# Patient Record
Sex: Female | Born: 1980 | Race: White | Hispanic: No | Marital: Married | Smoking: Never smoker
Health system: Southern US, Community
[De-identification: ages and names within clinical notes are randomized; demographics above are authoritative.]

## PROBLEM LIST (undated history)

## (undated) DIAGNOSIS — Z789 Other specified health status: Secondary | ICD-10-CM

## (undated) HISTORY — PX: SKIN CANCER EXCISION: SHX779

## (undated) HISTORY — PX: RECTAL SURGERY: SHX760

## (undated) HISTORY — PX: LASIK: SHX215

---

## 2013-02-16 ENCOUNTER — Other Ambulatory Visit (HOSPITAL_COMMUNITY): Payer: Self-pay | Admitting: Gynecology

## 2013-02-16 DIAGNOSIS — Z3141 Encounter for fertility testing: Secondary | ICD-10-CM

## 2013-02-28 ENCOUNTER — Ambulatory Visit (HOSPITAL_COMMUNITY)
Admission: RE | Admit: 2013-02-28 | Discharge: 2013-02-28 | Disposition: A | Discharge status: Home / Self Care | Payer: BC Managed Care – PPO | Source: Ambulatory Visit | Attending: Gynecology | Admitting: Gynecology

## 2013-02-28 DIAGNOSIS — N979 Female infertility, unspecified: Secondary | ICD-10-CM | POA: Insufficient documentation

## 2013-02-28 DIAGNOSIS — Z3141 Encounter for fertility testing: Secondary | ICD-10-CM

## 2013-02-28 MED ORDER — IOHEXOL 300 MG/ML  SOLN
20.0000 mL | Freq: Once | INTRAMUSCULAR | Status: AC | PRN
  Administered 2013-02-28: 20 mL

## 2013-06-26 LAB — OB RESULTS CONSOLE RUBELLA ANTIBODY, IGM: RUBELLA: IMMUNE

## 2013-06-26 LAB — OB RESULTS CONSOLE HIV ANTIBODY (ROUTINE TESTING): HIV: NONREACTIVE

## 2013-06-26 LAB — OB RESULTS CONSOLE ABO/RH: RH Type: POSITIVE

## 2013-06-26 LAB — OB RESULTS CONSOLE ANTIBODY SCREEN: ANTIBODY SCREEN: NEGATIVE

## 2013-06-26 LAB — OB RESULTS CONSOLE HEPATITIS B SURFACE ANTIGEN: HEP B S AG: NEGATIVE

## 2013-06-26 LAB — OB RESULTS CONSOLE RPR: RPR: NONREACTIVE

## 2013-07-03 ENCOUNTER — Other Ambulatory Visit: Payer: Self-pay

## 2013-07-03 LAB — OB RESULTS CONSOLE GC/CHLAMYDIA
Chlamydia: NEGATIVE
Gonorrhea: NEGATIVE

## 2013-09-27 ENCOUNTER — Other Ambulatory Visit (HOSPITAL_COMMUNITY): Payer: Self-pay | Admitting: Obstetrics and Gynecology

## 2013-09-27 DIAGNOSIS — O43129 Velamentous insertion of umbilical cord, unspecified trimester: Secondary | ICD-10-CM

## 2013-09-28 ENCOUNTER — Encounter (HOSPITAL_COMMUNITY): Payer: Self-pay | Admitting: Obstetrics and Gynecology

## 2013-10-03 ENCOUNTER — Encounter (HOSPITAL_COMMUNITY): Payer: Self-pay

## 2013-10-03 ENCOUNTER — Ambulatory Visit (HOSPITAL_COMMUNITY)
Admission: RE | Admit: 2013-10-03 | Discharge: 2013-10-03 | Disposition: A | Discharge status: Home / Self Care | Payer: BC Managed Care – PPO | Source: Ambulatory Visit | Attending: Obstetrics and Gynecology | Admitting: Obstetrics and Gynecology

## 2013-10-03 ENCOUNTER — Other Ambulatory Visit (HOSPITAL_COMMUNITY): Payer: Self-pay | Admitting: Obstetrics and Gynecology

## 2013-10-03 DIAGNOSIS — O358XX0 Maternal care for other (suspected) fetal abnormality and damage, not applicable or unspecified: Secondary | ICD-10-CM | POA: Insufficient documentation

## 2013-10-03 DIAGNOSIS — Z363 Encounter for antenatal screening for malformations: Secondary | ICD-10-CM | POA: Insufficient documentation

## 2013-10-03 DIAGNOSIS — IMO0001 Reserved for inherently not codable concepts without codable children: Secondary | ICD-10-CM | POA: Insufficient documentation

## 2013-10-03 DIAGNOSIS — Z1389 Encounter for screening for other disorder: Secondary | ICD-10-CM | POA: Insufficient documentation

## 2013-10-03 DIAGNOSIS — O43129 Velamentous insertion of umbilical cord, unspecified trimester: Secondary | ICD-10-CM

## 2013-10-03 NOTE — Progress Notes (Signed)
[facility] ultrasound   Indication: 33 yr old G1P0 at 4215w3d for fetal anatomic survey. Finding of velamentous cord insertion on outside ultrasound.  Findings: 1. Single intrauterine pregnancy. 2. Estimated fetal weight is in the 50th%. 3. Posterior placenta without evidence of previa. 4. There is a velamentous cord insertion. There is no evidence of vasa previa. 5. Normal amniotic fluid volume. 6. Normal transvaginal cervical length. 7. The views of the cavum and heart are limited. 8. The remainder of the limited anatomy survey is normal.  Recommendations: 1. Appropriate fetal growth. 2. Limited anatomy survey: - recommend follow up in 3 weeks to complete anatomic survey 3. Velamentous cord insertion: - discussed no evidence of vasa previa- reevaluate in 3 weeks - bleeding precautions reviewed - discussed association with increased risk of fetal growth restriction - recommend fetal growth every 4-6 weeks - recommend antenatal testing only in the setting of growth restriction  Eulis FosterKristen Jadore Mcguffin, MD

## 2013-10-20 ENCOUNTER — Other Ambulatory Visit (HOSPITAL_COMMUNITY): Payer: Self-pay | Admitting: Obstetrics and Gynecology

## 2013-10-20 DIAGNOSIS — O43129 Velamentous insertion of umbilical cord, unspecified trimester: Secondary | ICD-10-CM

## 2013-10-24 ENCOUNTER — Ambulatory Visit (HOSPITAL_COMMUNITY)
Admission: RE | Admit: 2013-10-24 | Discharge: 2013-10-24 | Disposition: A | Discharge status: Home / Self Care | Payer: BC Managed Care – PPO | Source: Ambulatory Visit | Attending: Obstetrics and Gynecology | Admitting: Obstetrics and Gynecology

## 2013-10-24 DIAGNOSIS — O43129 Velamentous insertion of umbilical cord, unspecified trimester: Secondary | ICD-10-CM

## 2013-10-24 DIAGNOSIS — IMO0001 Reserved for inherently not codable concepts without codable children: Secondary | ICD-10-CM | POA: Insufficient documentation

## 2013-11-21 ENCOUNTER — Ambulatory Visit (HOSPITAL_COMMUNITY): Payer: BC Managed Care – PPO

## 2013-12-17 IMAGING — RF DG HYSTEROGRAM
5 series · 5 of 5 positions shown · non-contrast
Comparison: none

CLINICAL DATA: Fertility testing

HYSTEROSALPINGOGRAM
TECHNIQUE: Hysterosalpingogram was performed by the ordering
physician under fluoroscopy.  Fluoroscopic images are submitted for
interpretation following the procedure.

[Series 1: run · 1 of 1 slices shown (1 of 5)]
[im 1/1]
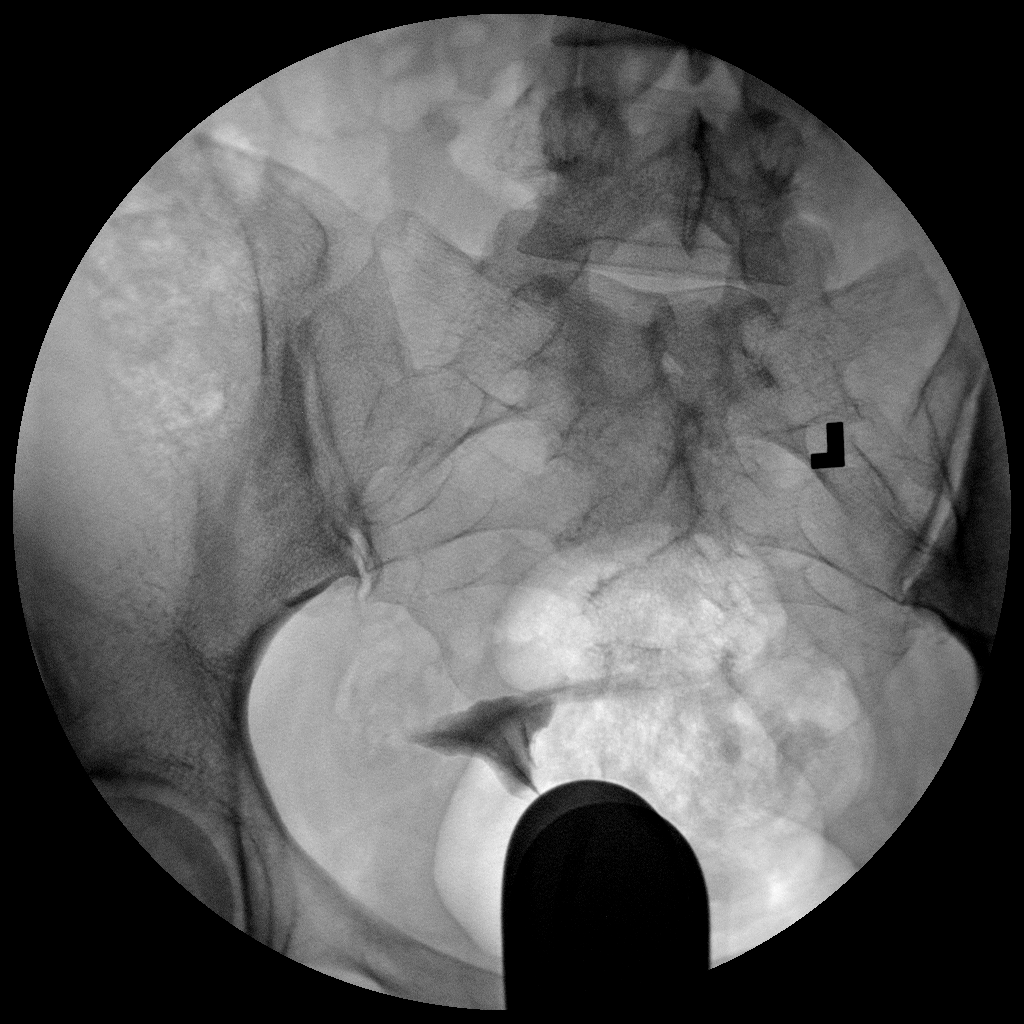

[Series 2: run · 1 of 1 slices shown (2 of 5)]
[im 1/1]
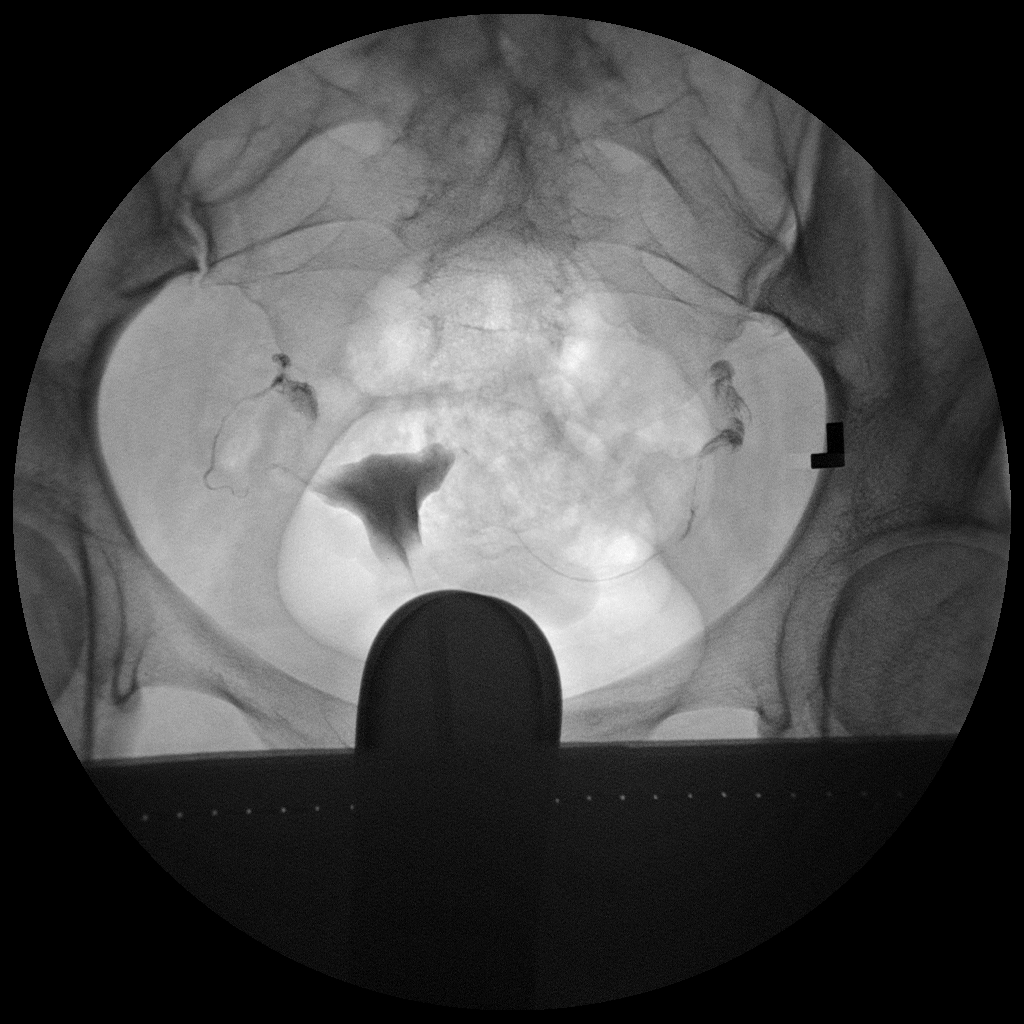

[Series 3: run · 1 of 1 slices shown (3 of 5)]
[im 1/1]
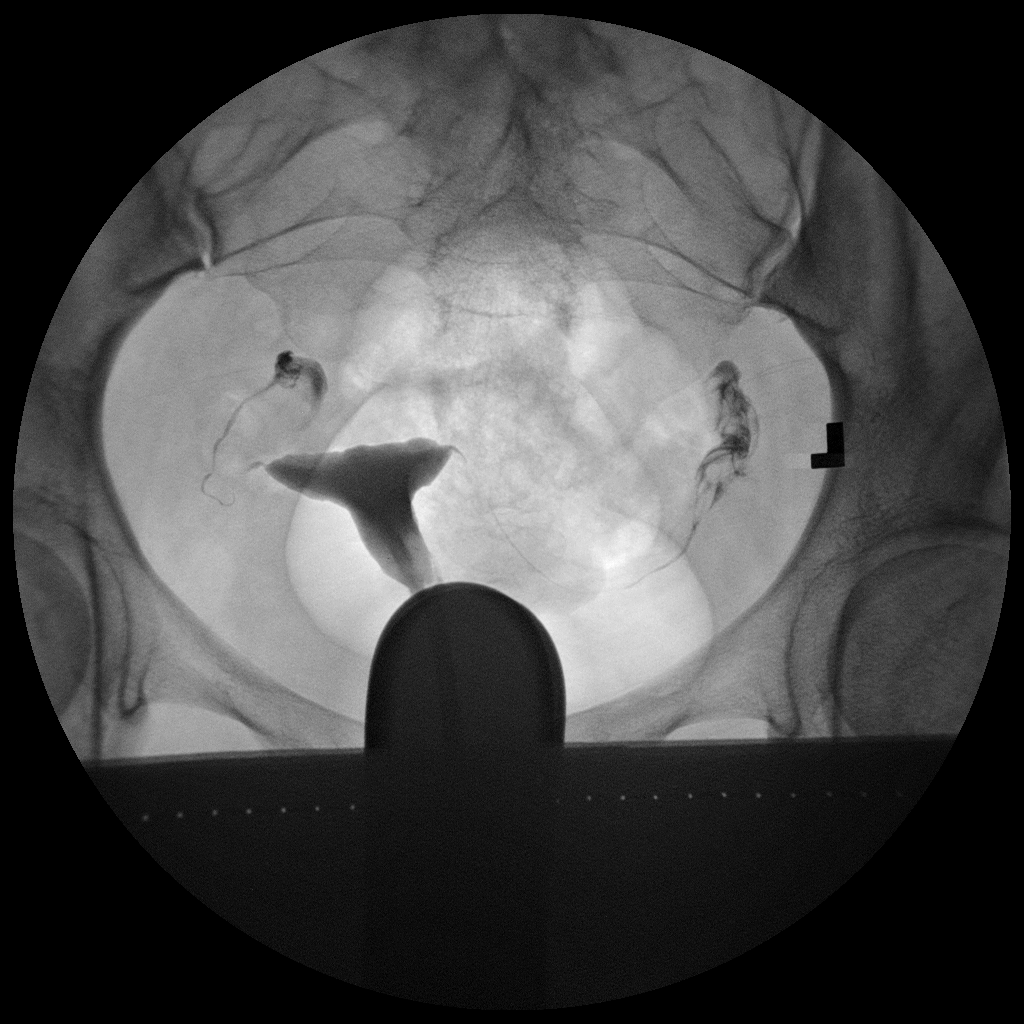

[Series 4: run · 1 of 1 slices shown (4 of 5)]
[im 1/1]
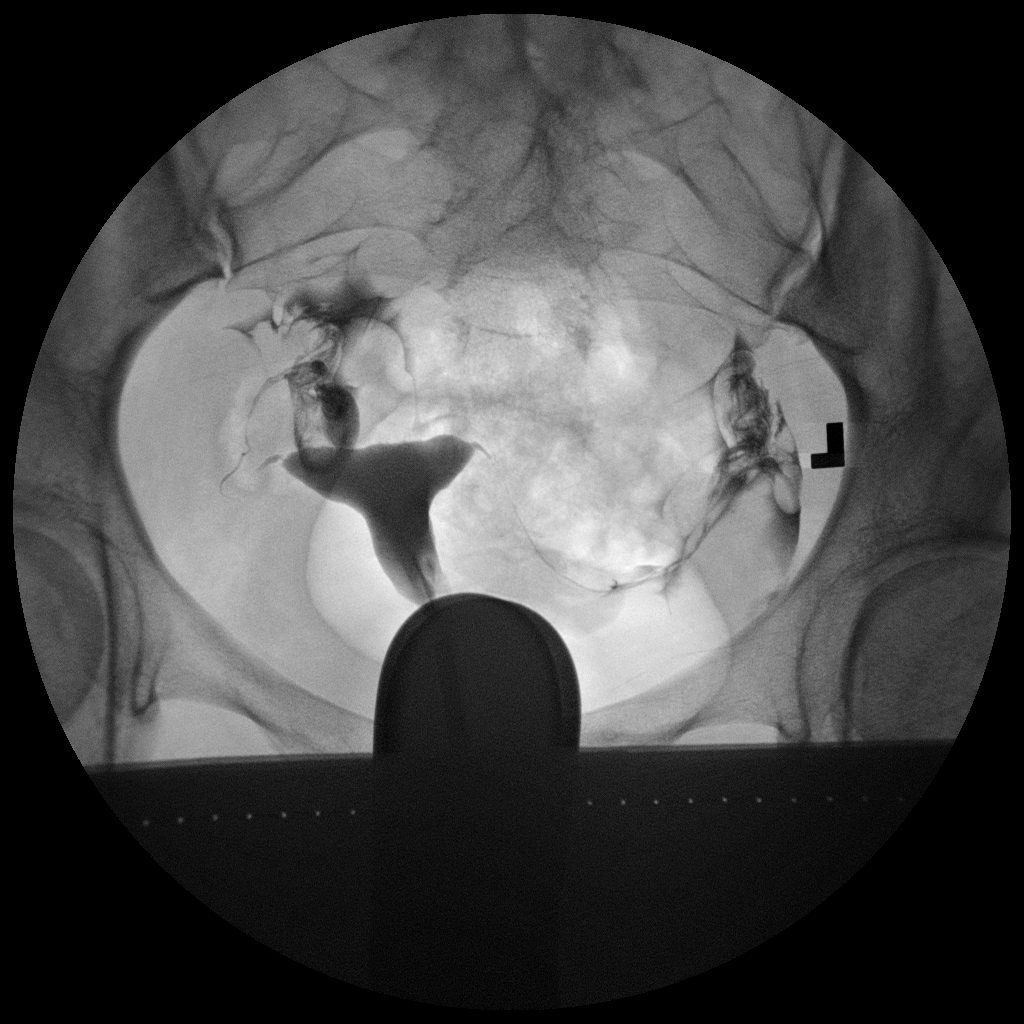

[Series 5: run · 1 of 1 slices shown (5 of 5)]
[im 1/1]
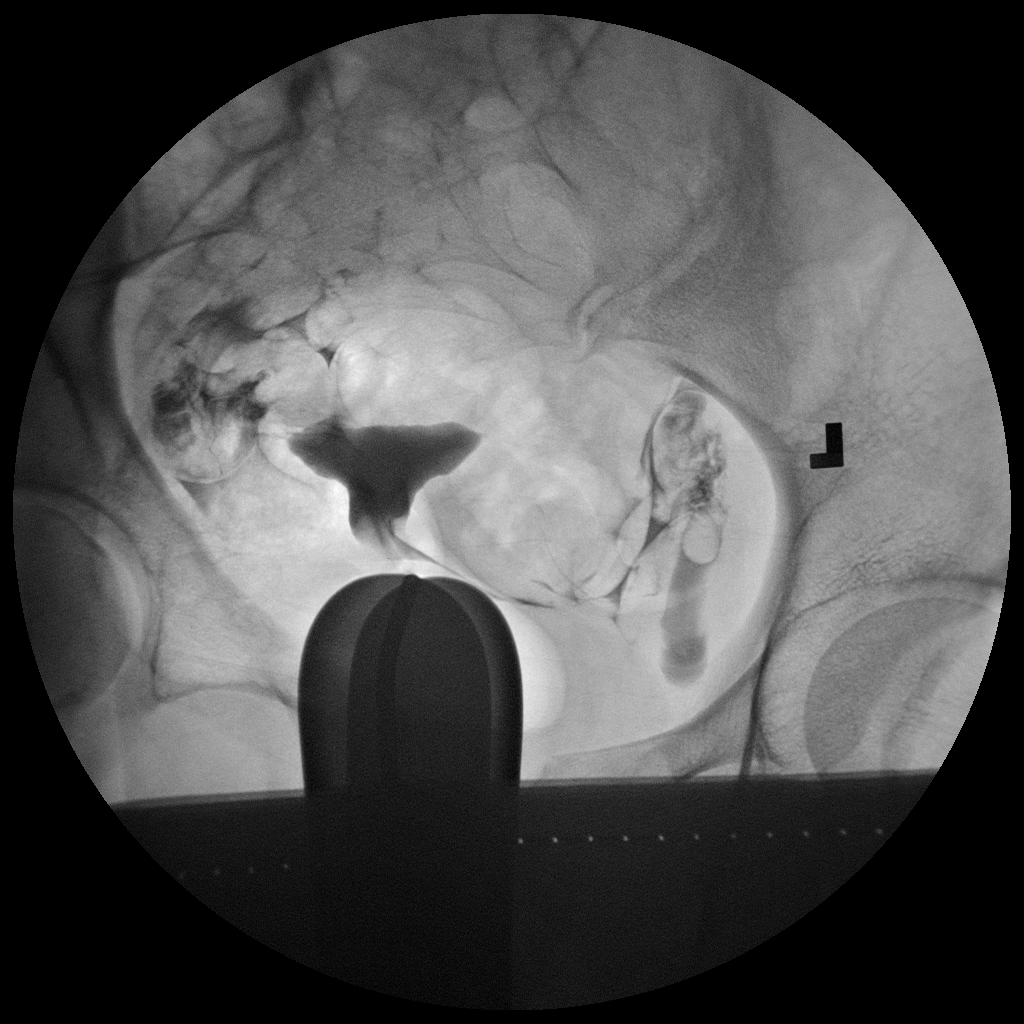

[5 of 5 positions shown; findings below may reference images not displayed]

FINDINGS: The endometrial cavity is normal in contour.  There are
no focal filling defects in the endometrial cavity.

Both fallopian tubes opacify and have a normal appearance.  There
is bilateral free intraperitoneal spill of contrast.
IMPRESSION: Normal hysterosalpingogram.

## 2014-01-03 LAB — OB RESULTS CONSOLE GBS: STREP GROUP B AG: NEGATIVE

## 2014-01-15 ENCOUNTER — Encounter (HOSPITAL_COMMUNITY): Payer: Self-pay | Admitting: *Deleted

## 2014-01-15 ENCOUNTER — Encounter (HOSPITAL_COMMUNITY): Payer: BC Managed Care – PPO | Admitting: Anesthesiology

## 2014-01-15 ENCOUNTER — Inpatient Hospital Stay (HOSPITAL_COMMUNITY): Payer: BC Managed Care – PPO | Admitting: Anesthesiology

## 2014-01-15 ENCOUNTER — Inpatient Hospital Stay (HOSPITAL_COMMUNITY)
Admission: AD | Admit: 2014-01-15 | Discharge: 2014-01-17 | DRG: 775 | Disposition: A | Discharge status: Home / Self Care | Payer: BC Managed Care – PPO | Source: Ambulatory Visit | Attending: Obstetrics & Gynecology | Admitting: Obstetrics & Gynecology

## 2014-01-15 HISTORY — DX: Other specified health status: Z78.9

## 2014-01-15 LAB — CBC
HCT: 40.3 % (ref 36.0–46.0)
Hemoglobin: 14.3 g/dL (ref 12.0–15.0)
MCH: 33 pg (ref 26.0–34.0)
MCHC: 35.5 g/dL (ref 30.0–36.0)
MCV: 93.1 fL (ref 78.0–100.0)
Platelets: 171 10*3/uL (ref 150–400)
RBC: 4.33 MIL/uL (ref 3.87–5.11)
RDW: 13.6 % (ref 11.5–15.5)
WBC: 11 10*3/uL — AB (ref 4.0–10.5)

## 2014-01-15 LAB — RPR

## 2014-01-15 LAB — POCT FERN TEST: POCT FERN TEST: POSITIVE

## 2014-01-15 MED ORDER — IBUPROFEN 600 MG PO TABS: 600.0000 mg | ORAL_TABLET | Freq: Four times a day (QID) | ORAL | Status: DC | PRN

## 2014-01-15 MED ORDER — OXYTOCIN BOLUS FROM INFUSION: 500.0000 mL | INTRAVENOUS | Status: DC

## 2014-01-15 MED ORDER — FLEET ENEMA 7-19 GM/118ML RE ENEM: 1.0000 | ENEMA | RECTAL | Status: DC | PRN

## 2014-01-15 MED ORDER — FENTANYL 2.5 MCG/ML BUPIVACAINE 1/10 % EPIDURAL INFUSION (WH - ANES)
14.0000 mL/h | INTRAMUSCULAR | Status: DC | PRN
  Administered 2014-01-15: 14 mL/h via EPIDURAL
  Filled 2014-01-15: qty 125

## 2014-01-15 MED ORDER — TETANUS-DIPHTH-ACELL PERTUSSIS 5-2.5-18.5 LF-MCG/0.5 IM SUSP: 0.5000 mL | Freq: Once | INTRAMUSCULAR | Status: DC

## 2014-01-15 MED ORDER — LACTATED RINGERS IV SOLN: 500.0000 mL | INTRAVENOUS | Status: DC | PRN

## 2014-01-15 MED ORDER — OXYTOCIN 40 UNITS IN LACTATED RINGERS INFUSION - SIMPLE MED
1.0000 m[IU]/min | INTRAVENOUS | Status: DC
  Administered 2014-01-15: 2 m[IU]/min via INTRAVENOUS
  Filled 2014-01-15: qty 1000

## 2014-01-15 MED ORDER — PHENYLEPHRINE 40 MCG/ML (10ML) SYRINGE FOR IV PUSH (FOR BLOOD PRESSURE SUPPORT)
80.0000 ug | PREFILLED_SYRINGE | INTRAVENOUS | Status: DC | PRN
  Filled 2014-01-15: qty 2
  Filled 2014-01-15: qty 10

## 2014-01-15 MED ORDER — ONDANSETRON HCL 4 MG/2ML IJ SOLN: 4.0000 mg | INTRAMUSCULAR | Status: DC | PRN

## 2014-01-15 MED ORDER — ZOLPIDEM TARTRATE 5 MG PO TABS: 5.0000 mg | ORAL_TABLET | Freq: Every evening | ORAL | Status: DC | PRN

## 2014-01-15 MED ORDER — OXYCODONE-ACETAMINOPHEN 5-325 MG PO TABS: 1.0000 | ORAL_TABLET | ORAL | Status: DC | PRN

## 2014-01-15 MED ORDER — LACTATED RINGERS IV SOLN
500.0000 mL | Freq: Once | INTRAVENOUS | Status: AC
  Administered 2014-01-15: 06:00:00 via INTRAVENOUS

## 2014-01-15 MED ORDER — ONDANSETRON HCL 4 MG PO TABS: 4.0000 mg | ORAL_TABLET | ORAL | Status: DC | PRN

## 2014-01-15 MED ORDER — PRENATAL MULTIVITAMIN CH
1.0000 | ORAL_TABLET | Freq: Every day | ORAL | Status: DC
  Administered 2014-01-16: 1 via ORAL
  Filled 2014-01-15 (×2): qty 1

## 2014-01-15 MED ORDER — WITCH HAZEL-GLYCERIN EX PADS
1.0000 | MEDICATED_PAD | CUTANEOUS | Status: DC | PRN
Start: 2014-01-15 — End: 2014-01-17

## 2014-01-15 MED ORDER — ACETAMINOPHEN 325 MG PO TABS: 650.0000 mg | ORAL_TABLET | ORAL | Status: DC | PRN

## 2014-01-15 MED ORDER — EPHEDRINE 5 MG/ML INJ
10.0000 mg | INTRAVENOUS | Status: DC | PRN
  Filled 2014-01-15: qty 2
  Filled 2014-01-15: qty 4

## 2014-01-15 MED ORDER — DIPHENHYDRAMINE HCL 25 MG PO CAPS
25.0000 mg | ORAL_CAPSULE | Freq: Four times a day (QID) | ORAL | Status: DC | PRN
Start: 2014-01-15 — End: 2014-01-17

## 2014-01-15 MED ORDER — SIMETHICONE 80 MG PO CHEW: 80.0000 mg | CHEWABLE_TABLET | ORAL | Status: DC | PRN

## 2014-01-15 MED ORDER — FLEET ENEMA 7-19 GM/118ML RE ENEM: 1.0000 | ENEMA | Freq: Every day | RECTAL | Status: DC | PRN

## 2014-01-15 MED ORDER — OXYTOCIN 40 UNITS IN LACTATED RINGERS INFUSION - SIMPLE MED
62.5000 mL/h | INTRAVENOUS | Status: DC
  Administered 2014-01-15: 62.5 mL/h via INTRAVENOUS

## 2014-01-15 MED ORDER — ONDANSETRON HCL 4 MG/2ML IJ SOLN: 4.0000 mg | Freq: Four times a day (QID) | INTRAMUSCULAR | Status: DC | PRN

## 2014-01-15 MED ORDER — SENNOSIDES-DOCUSATE SODIUM 8.6-50 MG PO TABS
2.0000 | ORAL_TABLET | ORAL | Status: DC
  Administered 2014-01-16 (×2): 2 via ORAL
  Filled 2014-01-15 (×2): qty 2

## 2014-01-15 MED ORDER — CITRIC ACID-SODIUM CITRATE 334-500 MG/5ML PO SOLN: 30.0000 mL | ORAL | Status: DC | PRN

## 2014-01-15 MED ORDER — EPHEDRINE 5 MG/ML INJ
10.0000 mg | INTRAVENOUS | Status: DC | PRN
  Filled 2014-01-15: qty 2

## 2014-01-15 MED ORDER — LIDOCAINE HCL (PF) 1 % IJ SOLN
INTRAMUSCULAR | Status: DC | PRN
  Administered 2014-01-15 (×2): 5 mL

## 2014-01-15 MED ORDER — IBUPROFEN 600 MG PO TABS
600.0000 mg | ORAL_TABLET | Freq: Four times a day (QID) | ORAL | Status: DC
  Administered 2014-01-15 – 2014-01-17 (×7): 600 mg via ORAL
  Filled 2014-01-15 (×8): qty 1

## 2014-01-15 MED ORDER — BISACODYL 10 MG RE SUPP: 10.0000 mg | Freq: Every day | RECTAL | Status: DC | PRN

## 2014-01-15 MED ORDER — TERBUTALINE SULFATE 1 MG/ML IJ SOLN: 0.2500 mg | Freq: Once | INTRAMUSCULAR | Status: DC | PRN

## 2014-01-15 MED ORDER — LACTATED RINGERS IV SOLN
INTRAVENOUS | Status: DC
  Administered 2014-01-15 (×2): via INTRAVENOUS

## 2014-01-15 MED ORDER — DIPHENHYDRAMINE HCL 50 MG/ML IJ SOLN: 12.5000 mg | INTRAMUSCULAR | Status: DC | PRN

## 2014-01-15 MED ORDER — DIBUCAINE 1 % RE OINT: 1.0000 "application " | TOPICAL_OINTMENT | RECTAL | Status: DC | PRN

## 2014-01-15 MED ORDER — BENZOCAINE-MENTHOL 20-0.5 % EX AERO
1.0000 "application " | INHALATION_SPRAY | CUTANEOUS | Status: DC | PRN
  Administered 2014-01-15 – 2014-01-16 (×2): 1 via TOPICAL
  Filled 2014-01-15 (×2): qty 56

## 2014-01-15 MED ORDER — LIDOCAINE HCL (PF) 1 % IJ SOLN
30.0000 mL | INTRAMUSCULAR | Status: DC | PRN
  Filled 2014-01-15: qty 30

## 2014-01-15 MED ORDER — LANOLIN HYDROUS EX OINT: TOPICAL_OINTMENT | CUTANEOUS | Status: DC | PRN

## 2014-01-15 MED ORDER — PHENYLEPHRINE 40 MCG/ML (10ML) SYRINGE FOR IV PUSH (FOR BLOOD PRESSURE SUPPORT)
80.0000 ug | PREFILLED_SYRINGE | INTRAVENOUS | Status: DC | PRN
  Filled 2014-01-15: qty 2

## 2014-01-15 NOTE — MAU Note (Signed)
Pt reports ROM at 0137, clear fluid. ? Contractions.

## 2014-01-15 NOTE — Progress Notes (Signed)
Delivery Note At 4:26 PM a viable female was delivered via Vaginal, Spontaneous Delivery (Presentation: ;  ).  APGAR: 9, 9; weight .   Placenta status: Intact, Spontaneous.  Cord: 3 vessels with the following complications: Velamentous.  Cord pH: 7.17  Anesthesia: Epidural  Episiotomy: None Lacerations: second degree post circumferential lac repaired Suture Repair: 2.0 vicryl rapide Est. Blood Loss (mL): 350  Mom to postpartum.  Baby to Couplet care / Skin to Skin.  TOMBLIN II,JAMES E 01/15/2014, 4:53 PM

## 2014-01-15 NOTE — Anesthesia Procedure Notes (Signed)
Epidural Patient location during procedure: OB Start time: 01/15/2014 11:53 AM  Staffing Anesthesiologist: Brayton CavesJACKSON, FREEMAN Performed by: anesthesiologist   Preanesthetic Checklist Completed: patient identified, site marked, surgical consent, pre-op evaluation, timeout performed, IV checked, risks and benefits discussed and monitors and equipment checked  Epidural Patient position: sitting Prep: site prepped and draped and DuraPrep Patient monitoring: continuous pulse ox and blood pressure Approach: midline Location: L3-L4 Injection technique: LOR air  Needle:  Needle type: Tuohy  Needle gauge: 17 G Needle length: 9 cm and 9 Needle insertion depth: 5 cm cm Catheter type: closed end flexible Catheter size: 19 Gauge Catheter at skin depth: 10 cm Test dose: negative  Assessment Events: blood not aspirated, injection not painful, no injection resistance, negative IV test and no paresthesia  Additional Notes Patient identified.  Risk benefits discussed including failed block, incomplete pain control, headache, nerve damage, paralysis, blood pressure changes, nausea, vomiting, reactions to medication both toxic or allergic, and postpartum back pain.  Patient expressed understanding and wished to proceed.  All questions were answered.  Sterile technique used throughout procedure and epidural site dressed with sterile barrier dressing. No paresthesia or other complications noted.The patient did not experience any signs of intravascular injection such as tinnitus or metallic taste in mouth nor signs of intrathecal spread such as rapid motor block. Please see nursing notes for vital signs.

## 2014-01-15 NOTE — H&P (Signed)
Jennifer Massey is a 33 y.o. female presenting for  SROM about 1:30 this am. No fever, no HA, no epigastric pain. Maternal Medical History:  Reason for admission: Rupture of membranes.   Fetal activity: Perceived fetal activity is normal.      OB History   Grav Para Term Preterm Abortions TAB SAB Ect Mult Living   1 0 0 0 0 0 0 0 0 0      Past Medical History  Diagnosis Date  . Medical history non-contributory    Past Surgical History  Procedure Laterality Date  . Skin cancer excision    . Rectal surgery    . Lasik     Family History: family history is not on file. Social History:  reports that she has never smoked. She does not have any smokeless tobacco history on file. She reports that she does not drink alcohol or use illicit drugs.   Prenatal Transfer Tool  Maternal Diabetes: No Genetic Screening: Normal Maternal Ultrasounds/Referrals: Normal Fetal Ultrasounds or other Referrals:  None Maternal Substance Abuse:  No Significant Maternal Medications:  None Significant Maternal Lab Results:  None Other Comments:  None  Review of Systems  Eyes: Negative for blurred vision.  Gastrointestinal: Negative for abdominal pain.  Neurological: Negative for headaches.    Dilation: 6 Effacement (%): 100 Station: 0 Exam by:: lee Blood pressure 129/76, pulse 82, temperature 97.8 F (36.6 C), temperature source Axillary, resp. rate 16, height 5\' 5"  (1.651 m), weight 73.483 kg (162 lb), last menstrual period 04/22/2013, SpO2 100.00%. Maternal Exam:  Abdomen: Fetal presentation: vertex     Physical Exam  Cardiovascular: Normal rate and regular rhythm.   Respiratory: Effort normal and breath sounds normal.  GI: Soft. There is no tenderness.  Neurological: She has normal reflexes.     Cx 9/C/-2/caput Prenatal labs: ABO, Rh: A/Positive/-- (12/08 0000) Antibody: Negative (12/08 0000) Rubella: Immune (12/08 0000) RPR: Nonreactive (12/08 0000)  HBsAg: Negative (12/08 0000)   HIV: Non-reactive (12/08 0000)  GBS: Negative (06/17 0000)   Assessment/Plan: 33 yo G1P0  In active labor  TOMBLIN II,JAMES E 01/15/2014, 2:37 PM

## 2014-01-15 NOTE — Anesthesia Preprocedure Evaluation (Signed)

## 2014-01-15 NOTE — Progress Notes (Signed)
Cx 3/90/-1 FHT reactive UCs q2-4 min, beginning to get firm Pitocin on

## 2014-01-16 LAB — CBC
HCT: 31.6 % — ABNORMAL LOW (ref 36.0–46.0)
Hemoglobin: 10.8 g/dL — ABNORMAL LOW (ref 12.0–15.0)
MCH: 32.4 pg (ref 26.0–34.0)
MCHC: 34.5 g/dL (ref 30.0–36.0)
MCV: 94 fL (ref 78.0–100.0)
Platelets: 138 10*3/uL — ABNORMAL LOW (ref 150–400)
RBC: 3.36 MIL/uL — ABNORMAL LOW (ref 3.87–5.11)
RDW: 14 % (ref 11.5–15.5)
WBC: 9.2 10*3/uL (ref 4.0–10.5)

## 2014-01-16 NOTE — Anesthesia Postprocedure Evaluation (Signed)
  Anesthesia Post-op Note  Patient: Teacher, [name]  Procedure(s) Performed: * No procedures listed *  Patient Location: Mother/Baby  Anesthesia Type:Epidural  Level of Consciousness: awake, alert , oriented and patient cooperative  Airway and Oxygen Therapy: Patient Spontanous Breathing  Post-op Pain: mild  Post-op Assessment: Patient's Cardiovascular Status Stable, Respiratory Function Stable, No headache, No backache, No residual numbness and No residual motor weakness  Post-op Vital Signs: stable  Last Vitals:  Filed Vitals:   01/16/14 0552  BP: 111/75  Pulse: 101  Temp: 37 C  Resp: 18    Complications: No apparent anesthesia complications

## 2014-01-16 NOTE — Progress Notes (Signed)
Post Partum Day 1 Subjective: no complaints, up ad lib, voiding, tolerating PO and + flatus  Objective: Blood pressure 111/75, pulse 101, temperature 98.6 F (37 C), temperature source Oral, resp. rate 18, height 5\' 5"  (1.651 m), weight 162 lb (73.483 kg), last menstrual period 04/22/2013, SpO2 100.00%, unknown if currently breastfeeding.  Physical Exam:  General: alert and cooperative Lochia: appropriate Uterine Fundus: firm Incision: perineum intact DVT Evaluation: No evidence of DVT seen on physical exam. Negative Homan's sign. No cords or calf tenderness. No significant calf/ankle edema.   Recent Labs  01/15/14 0503 01/16/14 0605  HGB 14.3 10.8*  HCT 40.3 31.6*    Assessment/Plan: Plan for discharge tomorrow and Circumcision prior to discharge   LOS: 1 day   [name],[name] 01/16/2014, 8:28 AM

## 2014-01-16 NOTE — Lactation Note (Signed)
This note was copied from the chart of Jennifer Criss AlvineKatie Massey. Lactation Consultation Note Initial visit at 30 hours of age.  Mom reports several good feedings, denies pain.  Baby is already latched on left breast with wide flanged lips and slower suck pattern.  Baby has been on the breast for several minutes already.  Mom reports not seeing colostrum with hand expression.  Demonstration does not yield any colostrum at this time.  Encouraged mom to try again later.  Previous latch scores of 10 and mom reports hearing swallows.  Bilateral erect nipples, right nipple flattens with compression. Hand pump given with demonstration and nipple continues to flatten.  Mom denies problems with latching on that breast.   [facility] resources given and discussed.  Encouraged to feed with early cues on demand.  Early newborn behavior discussed.   Mom to call for assist as needed.    Patient Name: Jennifer Criss AlvineKatie Kaneshiro ZOXWR'UToday's Date: 01/16/2014 Reason for consult: Initial assessment   Maternal Data Has patient been taught Hand Expression?: Yes Does the patient have breastfeeding experience prior to this delivery?: No  Feeding Feeding Type: Breast Fed Length of feed: 20 min  LATCH Score/Interventions Latch: Grasps breast easily, tongue down, lips flanged, rhythmical sucking.  Audible Swallowing: A few with stimulation (per mom ) Intervention(s): Hand expression  Type of Nipple: Everted at rest and after stimulation (right nipple flattens with compression)  Comfort (Breast/Nipple): Soft / non-tender     Hold (Positioning): No assistance needed to correctly position infant at breast.  LATCH Score: 9  Lactation Tools Discussed/Used     Consult Status Consult Status: Follow-up Date: 01/17/14 Follow-up type: In-patient    Beverely RisenShoptaw, Arvella MerlesJana Lynn 01/16/2014, 10:53 PM

## 2014-01-17 MED ORDER — IBUPROFEN 600 MG PO TABS: 600.0000 mg | ORAL_TABLET | Freq: Four times a day (QID) | ORAL | Status: AC

## 2014-01-17 NOTE — Progress Notes (Signed)
Post Partum Day 2 Subjective: no complaints, up ad lib, voiding, tolerating PO and + flatus  Objective: Blood pressure 106/73, pulse 87, temperature 98.6 F (37 C), temperature source Oral, resp. rate 20, height 5\' 5"  (1.651 m), weight 162 lb (73.483 kg), last menstrual period 04/22/2013, SpO2 100.00%, unknown if currently breastfeeding.  Physical Exam:  General: alert and cooperative Lochia: appropriate Uterine Fundus: firm Incision: perineum intact, suture seen on R labial laceration repair, which is intact , strings noted longer DVT Evaluation: No evidence of DVT seen on physical exam. Negative Homan's sign. No cords or calf tenderness. No significant calf/ankle edema.   Recent Labs  01/15/14 0503 01/16/14 0605  HGB 14.3 10.8*  HCT 40.3 31.6*    Assessment/Plan: Discharge home  Unable to sign discharge orders, physician needs to complete admission orders   LOS: 2 days   Jennifer Massey 01/17/2014, 8:23 AM

## 2014-01-17 NOTE — Discharge Summary (Signed)
Obstetric Discharge Summary Reason for Admission: rupture of membranes Prenatal Procedures: ultrasound Intrapartum Procedures: spontaneous vaginal delivery Postpartum Procedures: none Complications-Operative and Postpartum: labial laceration Hemoglobin  Date Value Ref Range Status  01/16/2014 10.8* 12.0 - 15.0 g/dL Final     DELTA CHECK NOTED     REPEATED TO VERIFY     HCT  Date Value Ref Range Status  01/16/2014 31.6* 36.0 - 46.0 % Final    Physical Exam:  General: alert and cooperative Lochia: appropriate Uterine Fundus: firm Incision: healing well DVT Evaluation: No evidence of DVT seen on physical exam. Negative Homan's sign. No cords or calf tenderness. Calf/Ankle edema is present.  Discharge Diagnoses: Term Pregnancy-delivered  Discharge Information: Date: 01/17/2014 Activity: pelvic rest Diet: routine Medications: PNV and Ibuprofen Condition: stable Instructions: refer to practice specific booklet Discharge to: home   Newborn Data: Live born female  Birth Weight: 6 lb 10 oz (3005 g) APGAR: 9, 9  Home with mother.  [name],[name] 01/17/2014, 8:22 AM

## 2014-05-21 ENCOUNTER — Encounter (HOSPITAL_COMMUNITY): Payer: Self-pay | Admitting: *Deleted

## 2024-03-13 ENCOUNTER — Ambulatory Visit: Payer: Self-pay

## 2024-03-27 ENCOUNTER — Ambulatory Visit: Payer: Self-pay

## 2024-03-27 DIAGNOSIS — D485 Neoplasm of uncertain behavior of skin: Secondary | ICD-10-CM

## 2024-03-27 DIAGNOSIS — D1801 Hemangioma of skin and subcutaneous tissue: Secondary | ICD-10-CM

## 2024-03-27 DIAGNOSIS — L814 Other melanin hyperpigmentation: Secondary | ICD-10-CM | POA: Diagnosis not present

## 2024-03-27 DIAGNOSIS — C44519 Basal cell carcinoma of skin of other part of trunk: Secondary | ICD-10-CM | POA: Diagnosis not present

## 2024-03-27 DIAGNOSIS — D489 Neoplasm of uncertain behavior, unspecified: Secondary | ICD-10-CM

## 2024-03-27 DIAGNOSIS — C4491 Basal cell carcinoma of skin, unspecified: Secondary | ICD-10-CM

## 2024-03-27 DIAGNOSIS — L821 Other seborrheic keratosis: Secondary | ICD-10-CM | POA: Diagnosis not present

## 2024-03-27 DIAGNOSIS — Z1283 Encounter for screening for malignant neoplasm of skin: Secondary | ICD-10-CM

## 2024-03-27 DIAGNOSIS — D229 Melanocytic nevi, unspecified: Secondary | ICD-10-CM

## 2024-03-27 DIAGNOSIS — Z85828 Personal history of other malignant neoplasm of skin: Secondary | ICD-10-CM

## 2024-03-27 DIAGNOSIS — W908XXA Exposure to other nonionizing radiation, initial encounter: Secondary | ICD-10-CM

## 2024-03-27 DIAGNOSIS — L578 Other skin changes due to chronic exposure to nonionizing radiation: Secondary | ICD-10-CM

## 2024-03-27 HISTORY — DX: Basal cell carcinoma of skin, unspecified: C44.91

## 2024-03-27 NOTE — Patient Instructions (Addendum)

## 2024-03-27 NOTE — Progress Notes (Signed)
   New Patient Visit   Subjective  Jennifer Massey is a 43 y.o. female who presents for the following: Patient reports area of concern on her back. 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   The following portions of the chart were reviewed this encounter and updated as appropriate: medications, allergies, medical history  Review of Systems:  No other skin or systemic complaints except as noted in HPI or Assessment and Plan.  Objective  Well appearing patient in no apparent distress; mood and affect are within normal limits.  A sun exposed exam was performed of face, arms, trunk, lower extremities - Angioma(s): Scattered red vascular papule(s) on the trunk - Lentigo/lentigines: Scattered pigmented macules that are tan to brown in color and are somewhat non-uniform in shape and concentrated in the sun-exposed areas of the trunk and upper extremities - Nevus/nevi: Scattered well-demarcated, regular, pigmented macule(s) and/or papule(s) on the scattered diffusely - Seborrheic Keratosis(es): Stuck-on appearing keratotic papule(s) on the trunk, none  irritated with redness, crusting, edema, and/or partial avulsion - Actinic Elastosis: chronic sun damage: dyspigmentation, telangiectasia, and wrinkling  Left upper back 1.9x1.2cm pink scaly plaque   Assessment & Plan    SKIN CANCER SCREENING PERFORMED TODAY.  BENIGN SKIN FINDINGS  - Lentigines  - Seborrheic keratoses  - Hemangiomas   - Nevus/Multiple Benign Nevi - Reassurance provided regarding the benign appearance of lesions noted on exam today; no treatment is indicated in the absence of symptoms/changes. - Reinforced importance of photoprotective strategies including liberal and frequent sunscreen use of a broad-spectrum SPF 30 or greater, use of protective clothing, and sun avoidance for prevention of cutaneous malignancy and photoaging.  Counseled patient on the importance of regular self-skin monitoring as well as routine clinical skin  examinations as scheduled.   ACTINIC DAMAGE - Chronic condition, secondary to cumulative UV/sun exposure - Recommend daily broad spectrum sunscreen SPF 30+ to sun-exposed areas, reapply every 2 hours as needed.  - Staying in the shade or wearing long sleeves, sun glasses (UVA+UVB protection) and wide brim hats (4-inch brim around the entire circumference of the hat) are also recommended for sun protection.  - Call for new or changing lesions.  Personal history of non melanoma skin cancer  - Reviewed medical history for full details  - Reviewed sun protective measures as above - Encouraged full body skin exams     NEOPLASM OF UNCERTAIN BEHAVIOR Left upper back Skin / nail biopsy Type of biopsy: tangential   Informed consent: discussed and consent obtained   Patient was prepped and draped in usual sterile fashion: Area prepped with alcohol. Anesthesia: the lesion was anesthetized in a standard fashion   Anesthetic:  1% lidocaine  w/ epinephrine 1-100,000 buffered w/ 8.4% NaHCO3 Instrument used: flexible razor blade   Hemostasis achieved with: pressure, aluminum chloride and electrodesiccation   Outcome: patient tolerated procedure well   Post-procedure details: wound care instructions given   Post-procedure details comment:  Ointment and small bandage applied  Specimen 1 - Surgical pathology Differential Diagnosis: SCC vs BCC   Check Margins: No SKIN EXAM FOR MALIGNANT NEOPLASM   LENTIGO   SEBORRHEIC KERATOSIS   CHERRY ANGIOMA    Return for TBSE.  I, Emerick Ege, CMA am acting as scribe for Lauraine JAYSON Kanaris, MD.  Documentation: I have reviewed the above documentation for accuracy and completeness, and I agree with the above.  Lauraine JAYSON Kanaris, MD

## 2024-03-29 ENCOUNTER — Ambulatory Visit: Payer: Self-pay

## 2024-03-29 LAB — SURGICAL PATHOLOGY

## 2024-03-30 ENCOUNTER — Ambulatory Visit: Payer: Self-pay

## 2024-03-30 NOTE — Telephone Encounter (Signed)
 Patient advised of BX results. She has decided to schedule EDC at this time.  She will think about the topical cream and call if she changes her mind. aw

## 2024-03-30 NOTE — Telephone Encounter (Signed)
-----   Message from Lauraine JAYSON Kanaris sent at 03/30/2024  1:34 PM EDT -----   1. Skin, left upper back :       SUPERFICIAL BASAL CELL CARCINOMA   Please notify patient with below plan: - Would recommend treatment with ED&C or imiquimod. Please let me know which patient prefers. Thanks!    ----- Message ----- From: Interface, [facility] In [facility] Sent: 03/29/2024   3:42 PM EDT To: Lauraine JAYSON Kanaris, MD

## 2024-04-24 ENCOUNTER — Ambulatory Visit

## 2024-04-24 DIAGNOSIS — C44519 Basal cell carcinoma of skin of other part of trunk: Secondary | ICD-10-CM | POA: Diagnosis not present

## 2024-04-24 DIAGNOSIS — C44619 Basal cell carcinoma of skin of left upper limb, including shoulder: Secondary | ICD-10-CM

## 2024-04-24 NOTE — Patient Instructions (Addendum)

## 2024-04-24 NOTE — Progress Notes (Signed)
    Subjective   Jennifer Massey is a 43 y.o. female who presents for the following: Follow up of EDC of BCC at left upper back. Patient is established patient .  Today patient reports: N/a  Review of Systems:    No other skin or systemic complaints except as noted in HPI or Assessment and Plan.  The following portions of the chart were reviewed this encounter and updated as appropriate: medications, allergies, medical history  Relevant Medical History:  Personal history of non melanoma skin cancer - see medical history for full details   Objective  Well appearing patient in no apparent distress; mood and affect are within normal limits. Examination was performed of the: Focused Exam of: Left upper back   Examination notable for: well healing bx site  Examination limited by: Undergarments, Clothing, and Patient deferred removal     Left Upper Back Healing biopsy site  Assessment & Plan   Level of service outlined above   Procedures, orders, diagnosis for this visit:  BASAL CELL CARCINOMA (BCC) OF SKIN OF LEFT UPPER EXTREMITY INCLUDING SHOULDER Left Upper Back Destruction of lesion Complexity: simple   Destruction method: electrodesiccation and curettage   Informed consent: discussed and consent obtained   Timeout:  patient name, date of birth, surgical site, and procedure verified Procedure prep:  Patient was prepped and draped in usual sterile fashion Prep type:  Isopropyl alcohol Anesthesia: the lesion was anesthetized in a standard fashion   Anesthetic:  1% lidocaine  w/ epinephrine 1-100,000 local infiltration Curettage performed in three different directions: Yes   Electrodesiccation performed over the curetted area: Yes   Curettage cycles:  3 Lesion length (cm):  1.3 Lesion width (cm):  2.2 Margin per side (cm):  0.2 Final wound size (cm):  2.6 Hemostasis achieved with:  aluminum chloride and electrodesiccation Outcome: patient tolerated procedure well with no  complications   Post-procedure details: sterile dressing applied and wound care instructions given   Dressing type: bandage and petrolatum     Basal cell carcinoma (BCC) of skin of left upper extremity including shoulder -     Destruction of lesion   Return to clinic: Return in about 6 months (around 10/23/2024) for TBSE, w/ Dr. Raymund, [facility].  I, Jacquelynn V. Wilfred, CMA, am acting as scribe for Lauraine JAYSON Raymund, MD .  Documentation: I have reviewed the above documentation for accuracy and completeness, and I agree with the above.  Lauraine JAYSON Raymund, MD

## 2024-10-23 ENCOUNTER — Ambulatory Visit
